# Patient Record
Sex: Male | Born: 1965 | Race: Black or African American | Hispanic: No
Health system: Southern US, Community
[De-identification: ages and names within clinical notes are randomized; demographics above are authoritative.]

---

## 2010-06-29 ENCOUNTER — Emergency Department (HOSPITAL_BASED_OUTPATIENT_CLINIC_OR_DEPARTMENT_OTHER)
Admission: EM | Admit: 2010-06-29 | Discharge: 2010-06-29 | Payer: Self-pay | Source: Home / Self Care | Admitting: Emergency Medicine

## 2010-09-07 LAB — URINALYSIS, ROUTINE W REFLEX MICROSCOPIC
Bilirubin Urine: NEGATIVE
Glucose, UA: NEGATIVE mg/dL
Hgb urine dipstick: NEGATIVE
Ketones, ur: NEGATIVE mg/dL
Nitrite: NEGATIVE
Protein, ur: NEGATIVE mg/dL
Specific Gravity, Urine: 1.027 (ref 1.005–1.030)
Urobilinogen, UA: 1 mg/dL (ref 0.0–1.0)
pH: 5.5 (ref 5.0–8.0)

## 2010-09-07 LAB — COMPREHENSIVE METABOLIC PANEL
ALT: 26 U/L (ref 0–53)
Albumin: 4 g/dL (ref 3.5–5.2)
Alkaline Phosphatase: 70 U/L (ref 39–117)
BUN: 19 mg/dL (ref 6–23)
Calcium: 9.3 mg/dL (ref 8.4–10.5)
GFR calc non Af Amer: >60 mL/min (ref >60)
Glucose, Bld: 79 mg/dL (ref 70–99)
Sodium: 144 mEq/L (ref 135–145)
Total Protein: 7.6 g/dL (ref 6.0–8.3)

## 2010-09-07 LAB — CBC
HCT: 38.7 % — ABNORMAL LOW (ref 39.0–52.0)
MCHC: 34.6 g/dL (ref 30.0–36.0)
MCV: 84.9 fL (ref 78.0–100.0)
RDW: 12.5 % (ref 11.5–15.5)

## 2010-09-07 LAB — DIFFERENTIAL
Lymphocytes Relative: 45 % (ref 12–46)
Lymphs Abs: 1.9 10*3/uL (ref 0.7–4.0)
Monocytes Relative: 9 % (ref 3–12)
Neutro Abs: 1.8 10*3/uL (ref 1.7–7.7)
Neutrophils Relative %: 42 % — ABNORMAL LOW (ref 43–77)

## 2019-07-12 ENCOUNTER — Other Ambulatory Visit: Payer: Self-pay

## 2019-07-12 ENCOUNTER — Emergency Department (HOSPITAL_COMMUNITY)
Admission: EM | Admit: 2019-07-12 | Discharge: 2019-07-12 | Disposition: A | Discharge status: Home / Self Care | Payer: Self-pay | Attending: Emergency Medicine | Admitting: Emergency Medicine

## 2019-07-12 ENCOUNTER — Encounter (HOSPITAL_COMMUNITY): Payer: Self-pay | Admitting: Emergency Medicine

## 2019-07-12 ENCOUNTER — Emergency Department (HOSPITAL_COMMUNITY): Payer: Self-pay

## 2019-07-12 DIAGNOSIS — Y990 Civilian activity done for income or pay: Secondary | ICD-10-CM | POA: Insufficient documentation

## 2019-07-12 DIAGNOSIS — Y929 Unspecified place or not applicable: Secondary | ICD-10-CM | POA: Insufficient documentation

## 2019-07-12 DIAGNOSIS — Y939 Activity, unspecified: Secondary | ICD-10-CM | POA: Insufficient documentation

## 2019-07-12 DIAGNOSIS — W231XXA Caught, crushed, jammed, or pinched between stationary objects, initial encounter: Secondary | ICD-10-CM | POA: Insufficient documentation

## 2019-07-12 DIAGNOSIS — S63602A Unspecified sprain of left thumb, initial encounter: Secondary | ICD-10-CM | POA: Insufficient documentation

## 2019-07-12 NOTE — ED Notes (Signed)
Gave pt ice pack for left thumb

## 2019-07-12 NOTE — Discharge Instructions (Signed)
Take Motrin and Tylenol as needed as directed. Apply ice to hand for 20 minutes at a time and elevate hand. Follow up with orthopedics next week for recheck.

## 2019-07-12 NOTE — ED Notes (Signed)
Patient verbalizes understanding of discharge instructions. Opportunity for questioning and answers were provided. Armband removed by staff, pt discharged from ED.  

## 2019-07-12 NOTE — ED Triage Notes (Signed)
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  States he smashed nail 2 months ago on same finger.

## 2019-07-12 NOTE — Progress Notes (Signed)
Orthopedic Tech Progress Note Patient Details:  Charles Andersen [birth year] [id number]  Ortho Devices Type of Ortho Device: Thumb velcro splint Ortho Device/Splint Location: left Ortho Device/Splint Interventions: Application   Post Interventions Patient Tolerated: Well Instructions Provided: Care of device   Saul Fordyce 07/12/2019, 2:07 PM

## 2019-07-12 NOTE — ED Notes (Signed)
Spoke with Ortho in regards to thumb spica. They will bring one from their supply for patient.

## 2019-07-12 NOTE — ED Provider Notes (Signed)
[facility] EMERGENCY DEPARTMENT Provider Note   CSN: [id number] Arrival date & time: 07/12/19  1241     History Chief Complaint  Patient presents with  . thumb pain    Charles Andersen is a 54 y.o. male.  54yo male presents with left thumb injury. 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 Patient reports pain to the distal left thumb. Patient denies any wounds/skin is intact, reports prior thumb injury with new nail growing out.  Patient is right hand dominant, no other injuries.         History reviewed. No pertinent past medical history.  There are no problems to display for this patient.   History reviewed. No pertinent surgical history.     No family history on file.  Social History   Tobacco Use  . Smoking status: Not on file  Substance Use Topics  . Alcohol use: Not on file  . Drug use: Not on file    Home Medications Prior to Admission medications   Not on File    Allergies    Patient has no allergy information on record.  Review of Systems   Review of Systems  Constitutional: Negative for fever.  Musculoskeletal: Positive for arthralgias, joint swelling and myalgias.  Skin: Negative for color change, rash and wound.  Allergic/Immunologic: Negative for immunocompromised state.  Neurological: Negative for weakness and numbness.  Hematological: Does not bruise/bleed easily.  Psychiatric/Behavioral: Negative for self-injury.  All other systems reviewed and are negative.   Physical Exam Updated Vital Signs BP (!) 129/96   Pulse 81   Temp 98.9 F (37.2 C) (Oral)   Resp 16   SpO2 96%   Physical Exam Vitals and nursing note reviewed.  Constitutional:      General: He is not in acute distress.    Appearance: He is well-developed. He is not diaphoretic.  HENT:     Head: Normocephalic and atraumatic.  Cardiovascular:     Pulses: Normal pulses.  Pulmonary:     Effort: Pulmonary effort is normal.    Musculoskeletal:        General: Swelling, tenderness and signs of injury present. No deformity.     Left hand: Swelling, tenderness and bony tenderness present. No deformity or lacerations. Decreased range of motion. Normal strength. Normal sensation. Normal capillary refill.       Arms:  Skin:    General: Skin is warm and dry.     Findings: No erythema or rash.  Neurological:     Mental Status: He is alert and oriented to person, place, and time.     Sensory: No sensory deficit.  Psychiatric:        Behavior: Behavior normal.     ED Results / Procedures / Treatments   Labs (all labs ordered are listed, but only abnormal results are displayed) Labs Reviewed - No data to display  EKG None  Radiology DG Hand Complete Left  Result Date: 07/12/2019 CLINICAL DATA:  Hand caught in power tool EXAM: LEFT HAND - COMPLETE 3+ VIEW COMPARISON:  None. FINDINGS: Frontal, oblique, and lateral views were obtained. Apparent injury in the region of the first digit nailbed noted. No fracture or dislocation. Joint spaces appear normal. No erosive change. IMPRESSION: Injury in first digit nailbed. No fracture or dislocation. No appreciable arthropathy. Electronically Signed   By: Bretta Bang III M.D.   On: 07/12/2019 13:19    Procedures Procedures (including critical care time)  Medications Ordered in ED Medications - No data to display  ED Course  I have reviewed the triage vital signs and the nursing notes.  Pertinent labs & imaging results that were available during my care of the patient were reviewed by me and considered in my medical decision making (see chart for details).  Clinical Course as of Jul 11 1442  Thu Jul 11, 5414  755 54 year old male with left thumb injury after his glove became stuck in an auger at work today resulting in a pulling/twisting injury to the left thumb.  Skin is intact, tenderness to the thumb, mild swelling, no ecchymosis.  Healing thumbnail injury  with new nail growth, nothing acute today.  X-rays negative for fracture.  Patient will be placed in a thumb spica splint and referred to hand for follow-up.   [LM]    Clinical Course User Index [LM] Roque Lias   MDM Rules/Calculators/A&P                      Final Clinical Impression(s) / ED Diagnoses Final diagnoses:  Sprain of left thumb, unspecified site of digit, initial encounter    Rx / DC Orders ED Discharge Orders    None       Tacy Learn, PA-C 07/12/19 [place], MD 07/18/19 1121

## 2021-04-26 IMAGING — DX DG HAND COMPLETE 3+V*L*
3 series · 3 of 3 positions shown · non-contrast
Comparison: None.

CLINICAL DATA: Hand caught in power tool

EXAM:
LEFT HAND - COMPLETE 3+ VIEW

[hand pa]
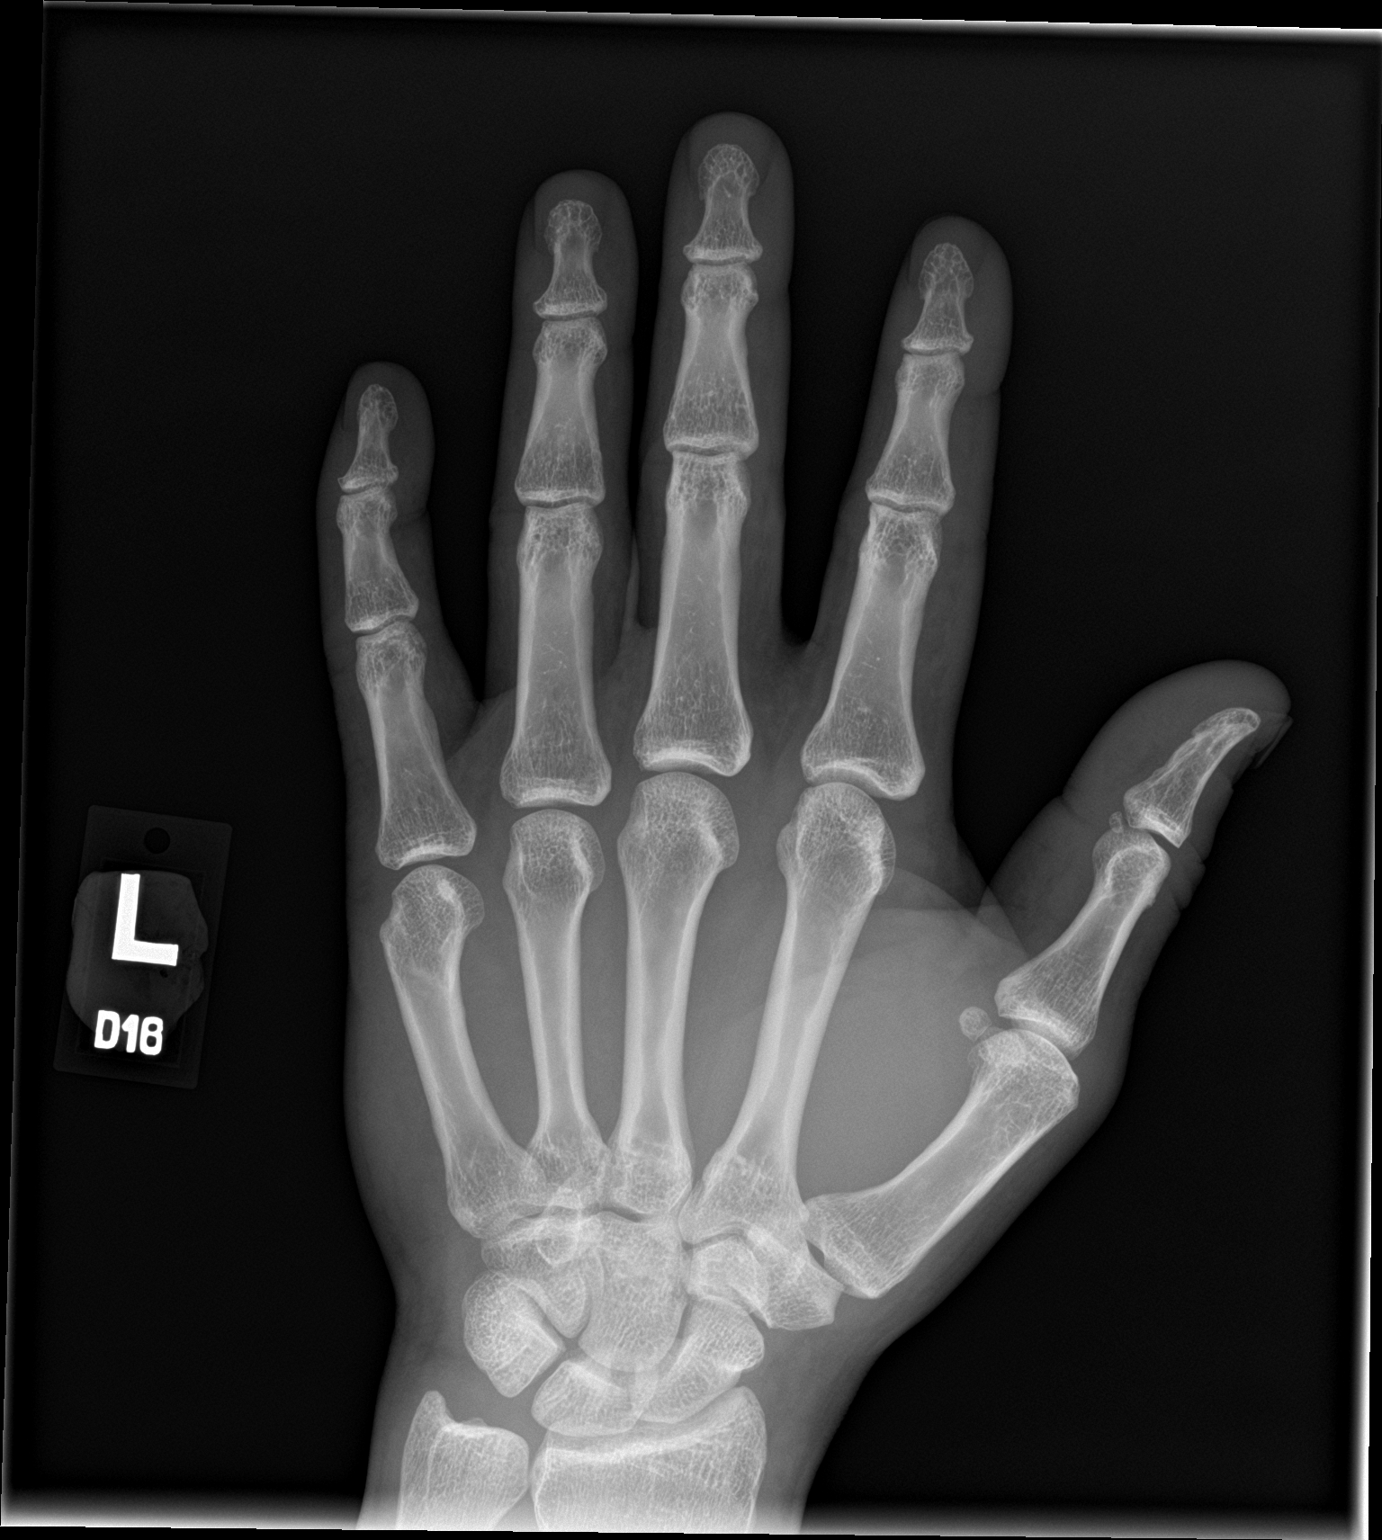

[hand obl]
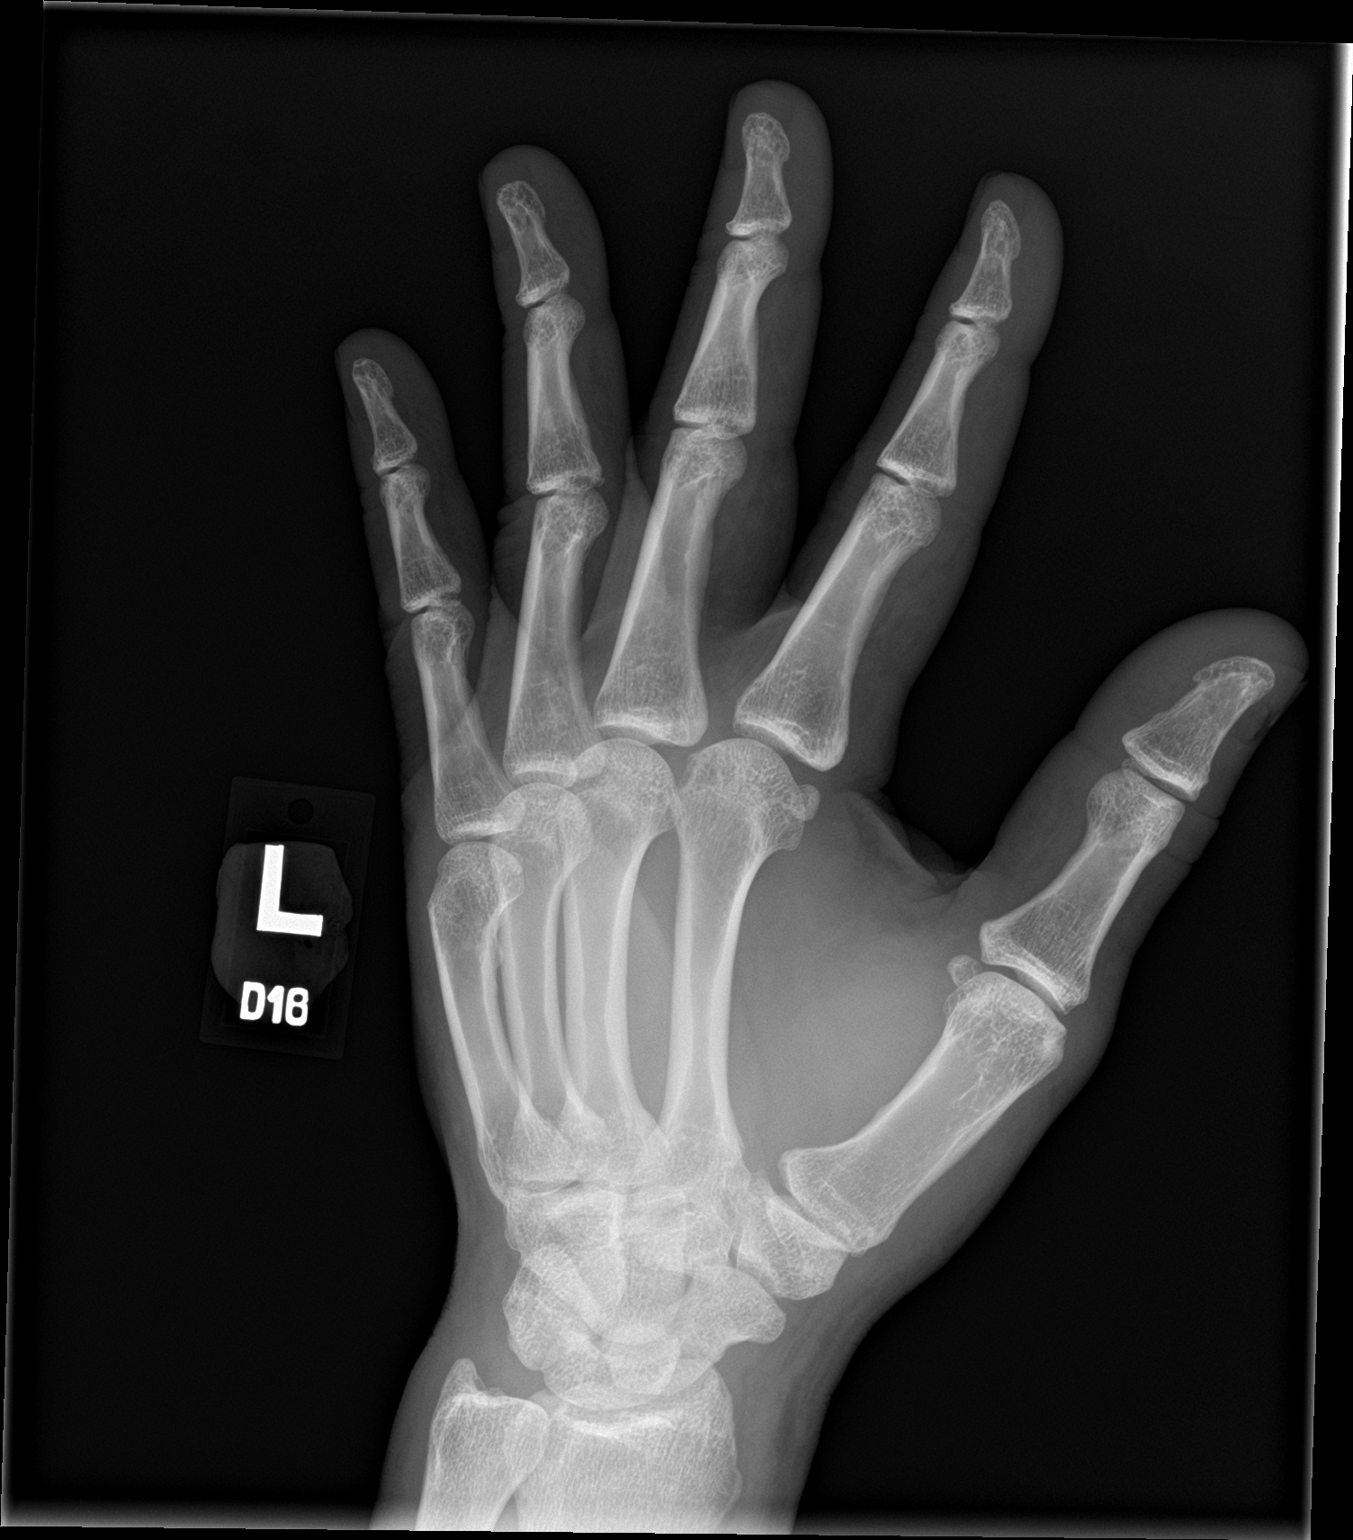

[hand lat]
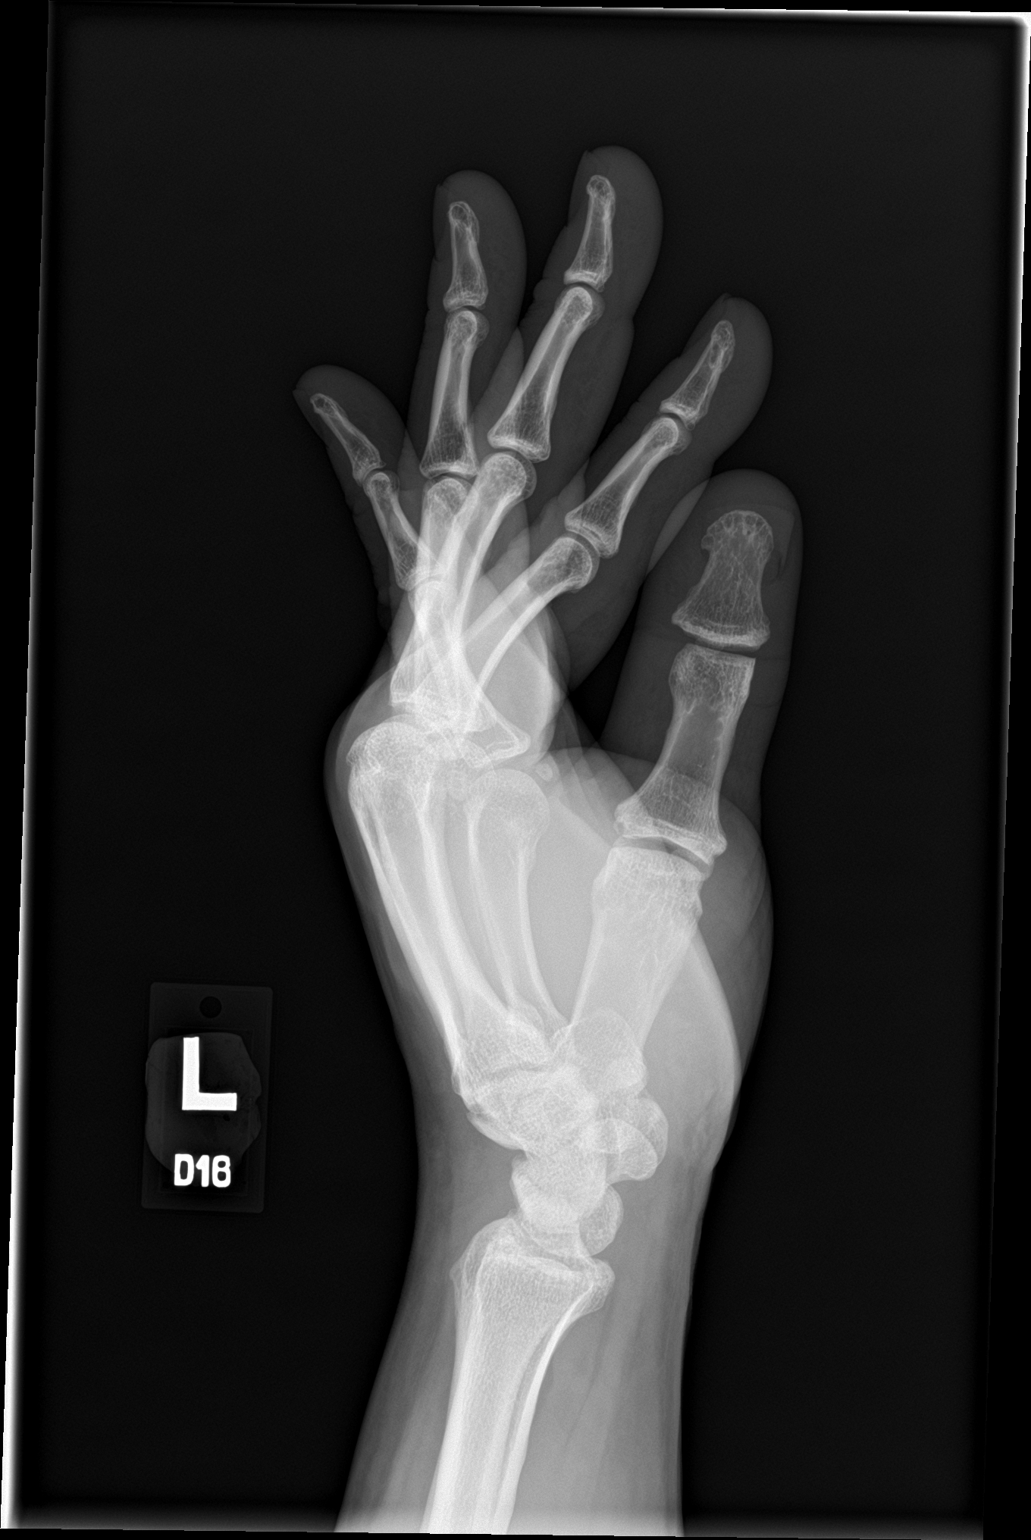

[3 of 3 positions shown; findings below may reference images not displayed]

FINDINGS: Frontal, oblique, and lateral views were obtained. Apparent injury
in the region of the first digit nailbed noted. No fracture or
dislocation. Joint spaces appear normal. No erosive change.
IMPRESSION: Injury in first digit nailbed. No fracture or dislocation. No
appreciable arthropathy.

## 2022-02-21 ENCOUNTER — Emergency Department (HOSPITAL_BASED_OUTPATIENT_CLINIC_OR_DEPARTMENT_OTHER)
Admission: EM | Admit: 2022-02-21 | Discharge: 2022-02-21 | Disposition: A | Discharge status: Home / Self Care | Payer: Self-pay | Attending: Emergency Medicine | Admitting: Emergency Medicine

## 2022-02-21 ENCOUNTER — Encounter (HOSPITAL_BASED_OUTPATIENT_CLINIC_OR_DEPARTMENT_OTHER): Payer: Self-pay | Admitting: Emergency Medicine

## 2022-02-21 ENCOUNTER — Emergency Department (HOSPITAL_BASED_OUTPATIENT_CLINIC_OR_DEPARTMENT_OTHER): Payer: Self-pay

## 2022-02-21 DIAGNOSIS — S61052A Open bite of left thumb without damage to nail, initial encounter: Secondary | ICD-10-CM | POA: Insufficient documentation

## 2022-02-21 DIAGNOSIS — W540XXA Bitten by dog, initial encounter: Secondary | ICD-10-CM | POA: Insufficient documentation

## 2022-02-21 DIAGNOSIS — S3125XA Open bite of penis, initial encounter: Secondary | ICD-10-CM | POA: Insufficient documentation

## 2022-02-21 DIAGNOSIS — Z203 Contact with and (suspected) exposure to rabies: Secondary | ICD-10-CM | POA: Insufficient documentation

## 2022-02-21 DIAGNOSIS — Z23 Encounter for immunization: Secondary | ICD-10-CM | POA: Insufficient documentation

## 2022-02-21 LAB — URINALYSIS, MICROSCOPIC (REFLEX): RBC / HPF: >50 RBC/hpf (ref 0–5)

## 2022-02-21 LAB — URINALYSIS, ROUTINE W REFLEX MICROSCOPIC
Bilirubin Urine: NEGATIVE
Glucose, UA: NEGATIVE mg/dL
Ketones, ur: NEGATIVE mg/dL
Leukocytes,Ua: NEGATIVE
Nitrite: NEGATIVE
Protein, ur: NEGATIVE mg/dL
Specific Gravity, Urine: >=1.03 (ref 1.005–1.030)
pH: 6 (ref 5.0–8.0)

## 2022-02-21 MED ORDER — AMOXICILLIN-POT CLAVULANATE 875-125 MG PO TABS: 1.0000 | ORAL_TABLET | Freq: Two times a day (BID) | ORAL | 0 refills | Status: AC

## 2022-02-21 MED ORDER — RABIES VACCINE, PCEC IM SUSR
1.0000 mL | Freq: Once | INTRAMUSCULAR | Status: AC
  Administered 2022-02-21: 1 mL via INTRAMUSCULAR
  Filled 2022-02-21: qty 1

## 2022-02-21 MED ORDER — TETANUS-DIPHTH-ACELL PERTUSSIS 5-2.5-18.5 LF-MCG/0.5 IM SUSY
0.5000 mL | PREFILLED_SYRINGE | Freq: Once | INTRAMUSCULAR | Status: AC
  Administered 2022-02-21: 0.5 mL via INTRAMUSCULAR
  Filled 2022-02-21: qty 0.5

## 2022-02-21 MED ORDER — RABIES IMMUNE GLOBULIN 150 UNIT/ML IM INJ
20.0000 [IU]/kg | INJECTION | Freq: Once | INTRAMUSCULAR | Status: AC
  Administered 2022-02-21: 1875 [IU]
  Filled 2022-02-21: qty 14

## 2022-02-21 NOTE — ED Notes (Signed)
Pt discharged to home. Discharge instructions have been discussed with patient and/or family members. Pt verbally acknowledges understanding d/c instructions, and endorses comprehension to checkout at registration before leaving.  °

## 2022-02-21 NOTE — ED Triage Notes (Addendum)
Pt reports he was bit by a family member's dog on L hand and penis about 45 minutes ago. Has blood in his urine.

## 2022-02-21 NOTE — ED Notes (Signed)
Spoke with lab to add on urine culture 

## 2022-02-21 NOTE — ED Provider Notes (Addendum)
[facility] EMERGENCY DEPARTMENT Provider Note   CSN: [id number] Arrival date & time: 02/21/22  1539     History  Chief Complaint  Patient presents with   Animal Bite    Charles Andersen is a 56 y.o. male.  Patient status post dog bite at about 1432 left hand and to the base of the penis.  Blood coming from the urethral meatus.  Patient tetanus not up-to-date.  Dog apparently is up-to-date on his vaccinations.  And to include the rabies vaccine.  Dog belongs to a family member.  At this point in time I think that the dog can be observed.       Home Medications Prior to Admission medications   Medication Sig Start Date End Date Taking? Authorizing Provider  amoxicillin-clavulanate (AUGMENTIN) 875-125 MG tablet Take 1 tablet by mouth every 12 (twelve) hours. 02/21/22  Yes Vanetta Mulders, MD      Allergies    Patient has no known allergies.    Review of Systems   Review of Systems  Constitutional:  Negative for chills and fever.  HENT:  Negative for ear pain and sore throat.   Eyes:  Negative for pain and visual disturbance.  Respiratory:  Negative for cough and shortness of breath.   Cardiovascular:  Negative for chest pain and palpitations.  Gastrointestinal:  Negative for abdominal pain and vomiting.  Genitourinary:  Negative for dysuria and hematuria.  Musculoskeletal:  Negative for arthralgias and back pain.  Skin:  Positive for wound. Negative for color change and rash.  Neurological:  Negative for seizures and syncope.  All other systems reviewed and are negative.   Physical Exam Updated Vital Signs BP (!) 150/97 (BP Location: Right Arm)   Pulse (!) 104   Temp 98.6 F (37 C) (Oral)   Resp 20   Wt 93.9 kg   SpO2 98%  Physical Exam Vitals and nursing note reviewed.  Constitutional:      General: He is not in acute distress.    Appearance: Normal appearance. He is well-developed. He is not ill-appearing.  HENT:     Head: Normocephalic and  atraumatic.     Mouth/Throat:     Mouth: Mucous membranes are moist.  Eyes:     Extraocular Movements: Extraocular movements intact.     Conjunctiva/sclera: Conjunctivae normal.     Pupils: Pupils are equal, round, and reactive to light.  Cardiovascular:     Rate and Rhythm: Normal rate and regular rhythm.     Heart sounds: No murmur heard. Pulmonary:     Effort: Pulmonary effort is normal. No respiratory distress.     Breath sounds: Normal breath sounds.  Abdominal:     Palpations: Abdomen is soft.     Tenderness: There is no abdominal tenderness.  Genitourinary:    Testes: Normal.     Comments: Patient with a single puncture wound at the base of the penis anteriorly.  No bites on the posterior aspect of the penis.  No bites to the scrotum or testicle area or groin area.  There is blood coming from the meatus.  Some bleeding from the puncture wound at the base of the penis but it is not actively hemorrhaging.  No clots from the meatus.  Patient is circumcised. Musculoskeletal:        General: Signs of injury present. No swelling.     Cervical back: Normal range of motion and neck supple.     Comments: Left hand on the dorsum  of the hand there is a puncture wound at the base of the left thumb.  And then also over the dorsum area of the first through third metacarpal area.  Good cap refill sensation intact good extension of the thumb and flexion and extension and flexion of all the fingers.  Radial pulses 2+.  Skin:    General: Skin is warm and dry.     Capillary Refill: Capillary refill takes less than 2 seconds.  Neurological:     General: No focal deficit present.     Mental Status: He is alert and oriented to person, place, and time.  Psychiatric:        Mood and Affect: Mood normal.     ED Results / Procedures / Treatments   Labs (all labs ordered are listed, but only abnormal results are displayed) Labs Reviewed  URINALYSIS, ROUTINE W REFLEX MICROSCOPIC - Abnormal; Notable  for the following components:      Result Value   APPearance CLOUDY (*)    Hgb urine dipstick LARGE (*)    All other components within normal limits  URINALYSIS, MICROSCOPIC (REFLEX) - Abnormal; Notable for the following components:   Bacteria, UA MANY (*)    All other components within normal limits  URINE CULTURE    EKG None  Radiology DG Hand Complete Left  Result Date: 02/21/2022 CLINICAL DATA:  Dog bite base of thumb. EXAM: LEFT HAND - COMPLETE 3+ VIEW COMPARISON:  Left hand x-ray 07/12/2019 FINDINGS: There is no evidence of fracture or dislocation. There is no evidence of arthropathy or other focal bone abnormality. There is soft tissue swelling over the dorsum of the wrist. No radiopaque foreign body identified. IMPRESSION: Soft tissue swelling dorsum of the wrist. No foreign body identified. No acute bony abnormality. Electronically Signed   By: Darliss Cheney M.D.   On: 02/21/2022 16:49    Procedures Procedures    Medications Ordered in ED Medications  rabies immune globulin (HYPERRAB/KEDRAB) injection 1,875 Units (has no administration in time range)  rabies vaccine (RABAVERT) injection 1 mL (has no administration in time range)  Tdap (BOOSTRIX) injection 0.5 mL (0.5 mLs Intramuscular Given 02/21/22 1629)    ED Course/ Medical Decision Making/ A&P                           Medical Decision Making Amount and/or Complexity of Data Reviewed Labs: ordered. Radiology: ordered.  Risk Prescription drug management.   Dog bite to the left hand.  With puncture wounds at the base of the thumb and over the 1st-3rd metacarpal area.  X-ray negative for any bony abnormalities.  Patient with excellent range of motion sensation intact.  Good cap refill.  Wounds were cleaned here.  Patient with base of the penis wound which probably did enter into the urethra since he has blood from the meatus.  Discussed with Dr. Annabell Howells on-call from urology.  Recommended to make sure patient was  able to void.  He was able to void without difficulty.  The urinalysis has many bacteria but also greater than 50 RBCs.  Sent for culture.  Not concerned about infection necessarily.  He did state that the Augmentin that we normally use for dog bites would be fine for the bite to the penis as well.  Initially we thought that the dog was going to be observed.  It appears the dog was sacrificed.  Not clear whether this was turned over to authorities to  have his brain evaluated to rule out rabies so patient will need to start with rabies immunoglobulin and the first rabies vaccine here today.  He will take Augmentin for prophylaxis to prevent infection.  Patient instructed that if he runs into trouble voiding would recommend that he hydrate himself well to help prevent that.  But if he does do go to the emergency department at [facility] or urology would be able to see him.  Patient knows that if the dog's brain is not examined left to continue with the but the rabies vaccine.  Also patient will follow-up with urology. Final Clinical Impression(s) / ED Diagnoses Final diagnoses:  Dog bite, initial encounter  Need for rabies vaccination    Rx / DC Orders ED Discharge Orders          Ordered    amoxicillin-clavulanate (AUGMENTIN) 875-125 MG tablet  Every 12 hours        02/21/22 1745              Vanetta Mulders, MD 02/21/22 1755    Vanetta Mulders, MD 02/21/22 [phone]

## 2022-02-21 NOTE — Discharge Instructions (Addendum)
Take the antibiotic Augmentin as directed for the next 7 days.  This is to help prevent infection in the left hand.  If you run into difficulty urinating follow-up at [facility] where urology can evaluate you for the urethral injury due to the dog bite to the base of the penis.  Very reassuring that you are able to urinate fine here.  Would recommend hydrating yourself very well so that she continue to urinate.  Since the dog was euthanized you need to start the rabies vaccine and follow through unless the dog was turned over to the state to have brain examined to make sure he had no evidence of any rabies.  Follow with urology call and make an appointment.

## 2022-02-22 LAB — URINE CULTURE: Culture: NO GROWTH
